# Patient Record
Sex: Male | Born: 1955 | Race: White | Hispanic: No | Marital: Married | Smoking: Never smoker
Health system: Southern US, Community
[De-identification: ages and names within clinical notes are randomized; demographics above are authoritative.]

## PROBLEM LIST (undated history)

## (undated) HISTORY — PX: BACK SURGERY: SHX140

## (undated) HISTORY — PX: EYE SURGERY: SHX253

---

## 2015-05-06 ENCOUNTER — Emergency Department (HOSPITAL_BASED_OUTPATIENT_CLINIC_OR_DEPARTMENT_OTHER): Payer: Worker's Compensation

## 2015-05-06 ENCOUNTER — Emergency Department (HOSPITAL_BASED_OUTPATIENT_CLINIC_OR_DEPARTMENT_OTHER)
Admission: EM | Admit: 2015-05-06 | Discharge: 2015-05-06 | Disposition: A | Discharge status: Home / Self Care | Payer: Worker's Compensation | Attending: Emergency Medicine | Admitting: Emergency Medicine

## 2015-05-06 ENCOUNTER — Encounter (HOSPITAL_BASED_OUTPATIENT_CLINIC_OR_DEPARTMENT_OTHER): Payer: Self-pay

## 2015-05-06 DIAGNOSIS — S68119A Complete traumatic metacarpophalangeal amputation of unspecified finger, initial encounter: Secondary | ICD-10-CM

## 2015-05-06 DIAGNOSIS — Z23 Encounter for immunization: Secondary | ICD-10-CM | POA: Insufficient documentation

## 2015-05-06 DIAGNOSIS — S68124A Partial traumatic metacarpophalangeal amputation of right ring finger, initial encounter: Secondary | ICD-10-CM | POA: Insufficient documentation

## 2015-05-06 DIAGNOSIS — Y929 Unspecified place or not applicable: Secondary | ICD-10-CM | POA: Insufficient documentation

## 2015-05-06 DIAGNOSIS — W231XXA Caught, crushed, jammed, or pinched between stationary objects, initial encounter: Secondary | ICD-10-CM | POA: Insufficient documentation

## 2015-05-06 DIAGNOSIS — Y998 Other external cause status: Secondary | ICD-10-CM | POA: Insufficient documentation

## 2015-05-06 DIAGNOSIS — Y9389 Activity, other specified: Secondary | ICD-10-CM | POA: Diagnosis not present

## 2015-05-06 DIAGNOSIS — S6991XA Unspecified injury of right wrist, hand and finger(s), initial encounter: Secondary | ICD-10-CM | POA: Diagnosis present

## 2015-05-06 MED ORDER — TETANUS-DIPHTH-ACELL PERTUSSIS 5-2.5-18.5 LF-MCG/0.5 IM SUSP
0.5000 mL | Freq: Once | INTRAMUSCULAR | Status: AC
  Administered 2015-05-06: 0.5 mL via INTRAMUSCULAR
  Filled 2015-05-06: qty 0.5

## 2015-05-06 MED ORDER — CEPHALEXIN 500 MG PO CAPS: 500.0000 mg | ORAL_CAPSULE | Freq: Three times a day (TID) | ORAL | Status: AC

## 2015-05-06 MED ORDER — CEPHALEXIN 250 MG PO CAPS
500.0000 mg | ORAL_CAPSULE | Freq: Once | ORAL | Status: AC
  Administered 2015-05-06: 500 mg via ORAL
  Filled 2015-05-06: qty 2

## 2015-05-06 NOTE — ED Notes (Addendum)
WC injury-right ring finger caught in machine-tip amputation-dsg intact upon arrival-pt reports finger tip in ziplock bag with ice

## 2015-05-06 NOTE — ED Notes (Signed)
Pt. returned from XR. 

## 2015-05-06 NOTE — ED Provider Notes (Signed)
CSN: [id number]     Arrival date & time 05/06/15  1700 History   First MD Initiated Contact with Patient 05/06/15 1715     Chief Complaint  Patient presents with  . Hand Injury     (Consider location/radiation/quality/duration/timing/severity/associated sxs/prior Treatment) The history is provided by the patient.  Taylor Fuller is a 59 y.o. male here presenting with right fourth finger injury. He was trying to put his finger in to a filtering machine and his fingertip got caught in the machine and had a amputation of the tip of the fourth finger. Denies any other injuries and doesn't remember when the last tetanus shot was. At this finger in the tip of bag with ice. He states that he was bleeding initially that stopped.    History reviewed. No pertinent past medical history. Past Surgical History  Procedure Laterality Date  . Back surgery    . Eye surgery     No family history on file. History  Substance Use Topics  . Smoking status: Never Smoker   . Smokeless tobacco: Not on file  . Alcohol Use: No    Review of Systems  Skin: Positive for wound.  All other systems reviewed and are negative.     Allergies  Review of patient's allergies indicates no known allergies.  Home Medications   Prior to Admission medications   Not on File   BP 160/86 mmHg  Pulse 106  Temp(Src) 98.5 F (36.9 C) (Oral)  Resp 18  Ht 6' (1.829 m)  Wt 210 lb (95.255 kg)  BMI 28.47 kg/m2  SpO2 99% Physical Exam  Constitutional: He is oriented to person, place, and time. He appears well-developed and well-nourished.  HENT:  Head: Normocephalic and atraumatic.  Eyes: Pupils are equal, round, and reactive to light.  Neck: Normal range of motion.  Cardiovascular: Normal rate.   Pulmonary/Chest: Effort normal.  Abdominal: Soft.  Neurological: He is alert and oriented to person, place, and time.  Skin: Skin is warm and dry.  Psychiatric: He has a normal mood and affect. His behavior is normal.  Judgment and thought content normal.  Nursing note and vitals reviewed.   ED Course  Procedures (including critical care time)    The wound is cleansed, debrided of foreign material as much as possible, and dressed. The patient is alerted to watch for any signs of infection (redness, pus, pain, increased swelling or fever) and call if such occurs. Home wound care instructions are provided. Tetanus vaccination status reviewed: tetanus status unknown to the patient.   Labs Review Labs Reviewed - No data to display  Imaging Review Dg Finger Ring Right  05/06/2015   CLINICAL DATA:  Right ring finger tip cut off on machine today. Pain and swelling.  EXAM: RIGHT RING FINGER 2+V  COMPARISON:  None.  FINDINGS: Amputation of the terminal tuft of the distal phalanx is seen. No other fractures identified. No evidence of dislocation.  IMPRESSION: Amputation of the terminal tuft of the distal phalanx. No other fractures identified.   Electronically Signed   By: Myles RosenthalJohn  Stahl M.D.   On: 05/06/2015 18:35     EKG Interpretation None      MDM   Final diagnoses:  None    Taylor Fuller is a 59 y.o. male here with R 4th finger amputation. Will get xray.   7:13 PM Tdap updated. Xray showed distal tuft fracture. Bleeding controlled. I cleaned the wound and applied dressing. Consulted Dr. Mina MarbleWeingold who recommend prophylactic abx  and he will see patient tomorrow for possible skin graft vs dressing changes.     Richardean Canal, MD 05/06/15 [id number]

## 2015-05-06 NOTE — ED Notes (Signed)
UDS completed 

## 2015-05-06 NOTE — ED Notes (Signed)
Dressing in place on initial assessment by this writer pt waiting UDS and currently having difficulty providing specimen

## 2015-05-06 NOTE — ED Notes (Signed)
MD at bedside. 

## 2015-05-06 NOTE — Discharge Instructions (Signed)
Take motrin, tylenol for pain.   Take keflex three times daily for a week.  See Dr. Mina MarbleWeingold tomorrow.  Return to ER if you have worse bleeding, severe pain, purulent discharge from wound.

## 2015-05-06 NOTE — ED Notes (Signed)
Pt reports pain 4/10, refuses pain medication

## 2016-07-19 IMAGING — DX DG FINGER RING 2+V*R*
4 series · 4 of 4 positions shown · non-contrast
Comparison: None.

CLINICAL DATA: Right ring finger tip cut off on machine today. Pain
and swelling.

EXAM:
RIGHT RING FINGER 2+V

[finger ap]
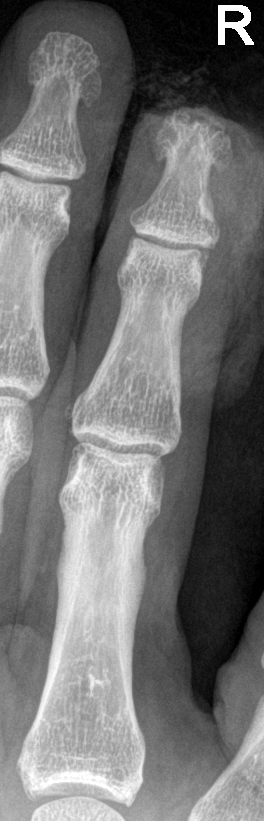

[finger obl]
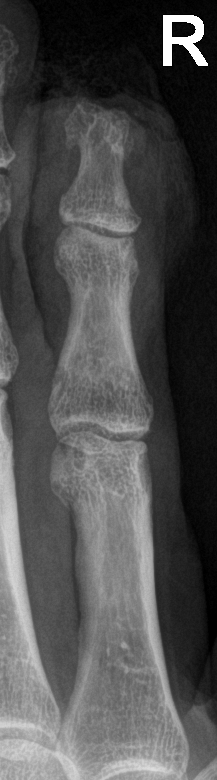

[finger lat (1 of 2)]
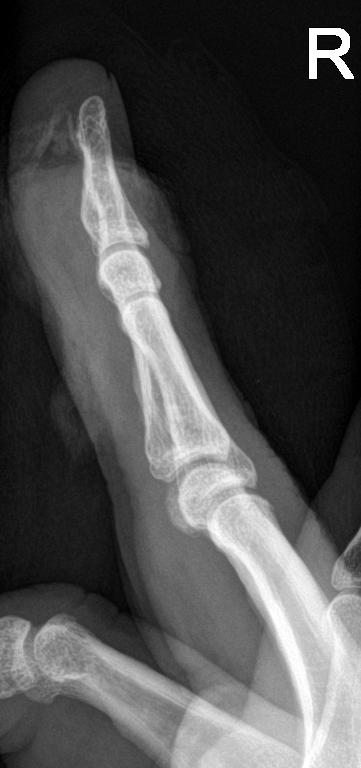

[finger lat (2 of 2)]
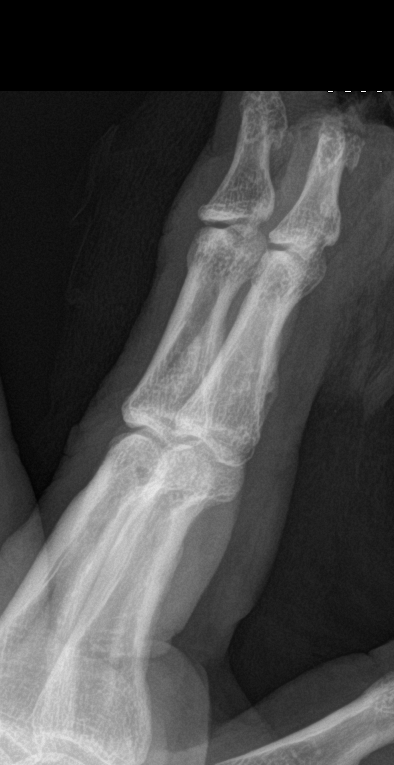

[4 of 4 positions shown; findings below may reference images not displayed]

FINDINGS: Amputation of the terminal tuft of the distal phalanx is seen. No
other fractures identified. No evidence of dislocation.
IMPRESSION: Amputation of the terminal tuft of the distal phalanx. No other
fractures identified.
